# Patient Record
Sex: Male | Born: 1980 | Race: Black or African American | Hispanic: No | Marital: Married | Smoking: Current every day smoker
Health system: Southern US, Community
[De-identification: ages and names within clinical notes are randomized; demographics above are authoritative.]

---

## 2006-05-24 ENCOUNTER — Emergency Department (HOSPITAL_COMMUNITY): Admission: EM | Admit: 2006-05-24 | Discharge: 2006-05-24 | Payer: Self-pay | Admitting: Emergency Medicine

## 2006-06-03 ENCOUNTER — Emergency Department (HOSPITAL_COMMUNITY): Admission: EM | Admit: 2006-06-03 | Discharge: 2006-06-03 | Payer: Self-pay | Admitting: Emergency Medicine

## 2009-02-04 ENCOUNTER — Emergency Department (HOSPITAL_COMMUNITY): Admission: EM | Admit: 2009-02-04 | Discharge: 2009-02-04 | Payer: Self-pay | Admitting: Emergency Medicine

## 2009-11-09 ENCOUNTER — Emergency Department (HOSPITAL_COMMUNITY): Admission: EM | Admit: 2009-11-09 | Discharge: 2009-11-10 | Payer: Self-pay | Admitting: Emergency Medicine

## 2010-02-24 ENCOUNTER — Emergency Department (HOSPITAL_COMMUNITY): Admission: EM | Admit: 2010-02-24 | Discharge: 2010-02-24 | Payer: Self-pay | Admitting: Emergency Medicine

## 2010-12-30 LAB — CBC
Hemoglobin: 14.7 g/dL (ref 13.0–17.0)
MCHC: 34 g/dL (ref 30.0–36.0)
Platelets: 266 10*3/uL (ref 150–400)
WBC: 15.6 10*3/uL — ABNORMAL HIGH (ref 4.0–10.5)

## 2010-12-30 LAB — BASIC METABOLIC PANEL
BUN: 18 mg/dL (ref 6–23)
Creatinine, Ser: 1.5 mg/dL (ref 0.4–1.5)
GFR calc Af Amer: >60 mL/min (ref >60)
GFR calc non Af Amer: 55 mL/min — ABNORMAL LOW (ref >60)
Glucose, Bld: 113 mg/dL — ABNORMAL HIGH (ref 70–99)

## 2010-12-30 LAB — DIFFERENTIAL
Basophils Absolute: 0 10*3/uL (ref 0.0–0.1)
Eosinophils Absolute: 0 10*3/uL (ref 0.0–0.7)
Eosinophils Relative: 0 % (ref 0–5)
Lymphs Abs: 0.6 10*3/uL — ABNORMAL LOW (ref 0.7–4.0)
Neutro Abs: 14.2 10*3/uL — ABNORMAL HIGH (ref 1.7–7.7)
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
## 2011-05-21 ENCOUNTER — Ambulatory Visit: Payer: Self-pay | Admitting: Family Medicine

## 2014-05-31 ENCOUNTER — Encounter (HOSPITAL_COMMUNITY): Payer: Self-pay | Admitting: Emergency Medicine

## 2014-05-31 ENCOUNTER — Emergency Department (HOSPITAL_COMMUNITY)
Admission: EM | Admit: 2014-05-31 | Discharge: 2014-05-31 | Disposition: A | Discharge status: Home / Self Care | Payer: Self-pay | Attending: Emergency Medicine | Admitting: Emergency Medicine

## 2014-05-31 DIAGNOSIS — J029 Acute pharyngitis, unspecified: Secondary | ICD-10-CM | POA: Insufficient documentation

## 2014-05-31 DIAGNOSIS — F17200 Nicotine dependence, unspecified, uncomplicated: Secondary | ICD-10-CM | POA: Insufficient documentation

## 2014-05-31 MED ORDER — LIDOCAINE VISCOUS 2 % MT SOLN
10.0000 mL | Freq: Once | OROMUCOSAL | Status: AC
  Administered 2014-05-31: 10 mL via OROMUCOSAL
  Filled 2014-05-31: qty 15

## 2014-05-31 MED ORDER — MAGIC MOUTHWASH W/LIDOCAINE: 10.0000 mL | Freq: Four times a day (QID) | ORAL | Status: DC | PRN

## 2014-05-31 MED ORDER — PENICILLIN G BENZATHINE 1200000 UNIT/2ML IM SUSP
1.2000 10*6.[IU] | Freq: Once | INTRAMUSCULAR | Status: AC
  Administered 2014-05-31: 1.2 10*6.[IU] via INTRAMUSCULAR
  Filled 2014-05-31: qty 2

## 2014-05-31 MED ORDER — IBUPROFEN 800 MG PO TABS
800.0000 mg | ORAL_TABLET | Freq: Once | ORAL | Status: AC
  Administered 2014-05-31: 800 mg via ORAL
  Filled 2014-05-31: qty 1

## 2014-05-31 MED ORDER — IBUPROFEN 600 MG PO TABS: 600.0000 mg | ORAL_TABLET | Freq: Three times a day (TID) | ORAL | Status: DC | PRN

## 2014-05-31 NOTE — ED Provider Notes (Signed)
CSN: [id number]     Arrival date & time 05/31/14  1829 History   First MD Initiated Contact with Patient 05/31/14 2024     Chief Complaint  Patient presents with  . Sore Throat     (Consider location/radiation/quality/duration/timing/severity/associated sxs/prior Treatment) The history is provided by the patient.   Ray Kramer is a 33 y.o. male presenting with a 24-hour history of sore throat, generalized fatigue, subjective headache and soreness of his gums and soft palate.  Pain is worse with swallowing and he has maintained a soft food and liquid diet today.  He has had no nausea or vomiting, denies cough, chest pain, nasal congestion.  He is taken no medications prior to arrival for his symptoms.     History reviewed. No pertinent past medical history. History reviewed. No pertinent past surgical history. No family history on file. History  Substance Use Topics  . Smoking status: Current Every Day Smoker    Types: Cigars  . Smokeless tobacco: Not on file  . Alcohol Use: Yes    Review of Systems  Constitutional: Positive for fever and chills.  HENT: Positive for sore throat. Negative for congestion, ear pain, rhinorrhea, sinus pressure, trouble swallowing and voice change.   Eyes: Negative for discharge.  Respiratory: Negative for cough, shortness of breath, wheezing and stridor.   Cardiovascular: Negative for chest pain.  Gastrointestinal: Negative for abdominal pain.  Genitourinary: Negative.       Allergies  Review of patient's allergies indicates no known allergies.  Home Medications   Prior to Admission medications   Medication Sig Start Date End Date Taking? Authorizing Provider  Alum & Mag Hydroxide-Simeth (MAGIC MOUTHWASH W/LIDOCAINE) SOLN Take 10 mLs by mouth 4 (four) times daily as needed (throat pain). 05/31/14   Burgess AmorJulie Esaias Cleavenger, PA-C  ibuprofen (ADVIL,MOTRIN) 600 MG tablet Take 1 tablet (600 mg total) by mouth every 8 (eight) hours as needed for  fever or moderate pain. 05/31/14   Burgess AmorJulie Marguriete Wootan, PA-C   BP 126/65  Pulse 83  Temp(Src) 100.1 F (37.8 C) (Oral)  Resp 20  Ht 6\' 2"  (1.88 m)  Wt 245 lb (111.131 kg)  BMI 31.44 kg/m2  SpO2 100% Physical Exam  Nursing note and vitals reviewed. Constitutional: He is oriented to person, place, and time. He appears well-developed and well-nourished.  Low-grade fever at 100.1  HENT:  Head: Normocephalic and atraumatic.  Right Ear: Tympanic membrane and ear canal normal.  Left Ear: Tympanic membrane and ear canal normal.  Nose: No mucosal edema or rhinorrhea.  Mouth/Throat: Uvula is midline and mucous membranes are normal. Posterior oropharyngeal erythema present. No oropharyngeal exudate, posterior oropharyngeal edema or tonsillar abscesses.  Petechiae noted on soft palate.  He has no mucosal or gingival lesions, although gingiva does appear slightly hyperemic along anterior lower central incisors.  He has bilateral submandibular adenopathy.  Eyes: Conjunctivae are normal.  Neck: Normal range of motion. Neck supple.  Cardiovascular: Normal rate and normal heart sounds.   Pulmonary/Chest: Effort normal. No respiratory distress. He has no wheezes. He has no rales.  Abdominal: Soft. There is no tenderness.  Musculoskeletal: Normal range of motion.  Neurological: He is alert and oriented to person, place, and time.  Skin: Skin is warm and dry. No rash noted.  Psychiatric: He has a normal mood and affect.    ED Course  Procedures (including critical care time) Labs Review Labs Reviewed - No data to display  Imaging Review No results found.   EKG  Interpretation None      MDM   Final diagnoses:  Pharyngitis    Pharyngitis and acute tonsillitis.  Patient was given Bicillin LA injection.  He was prescribed ibuprofen and Magic mouthwash for throat pain relief.  He was encouraged rest, increase fluid intake.  Recheck here for any worsened or persistent symptoms.  He and his wife agree  with treatment plan.    Burgess Amor, PA-C 05/31/14 2128

## 2014-05-31 NOTE — ED Notes (Signed)
Sore throat since Tuesday. Soreness to gums. Possible fever the other night with slight headache yesterday.

## 2014-05-31 NOTE — Discharge Instructions (Signed)
Pharyngitis Pharyngitis is redness, pain, and swelling (inflammation) of your pharynx.  CAUSES  Pharyngitis is usually caused by infection. Most of the time, these infections are from viruses (viral) and are part of a cold. However, sometimes pharyngitis is caused by bacteria (bacterial). Pharyngitis can also be caused by allergies. Viral pharyngitis may be spread from person to person by coughing, sneezing, and personal items or utensils (cups, forks, spoons, toothbrushes). Bacterial pharyngitis may be spread from person to person by more intimate contact, such as kissing.  SIGNS AND SYMPTOMS  Symptoms of pharyngitis include:   Sore throat.   Tiredness (fatigue).   Low-grade fever.   Headache.  Joint pain and muscle aches.  Skin rashes.  Swollen lymph nodes.  Plaque-like film on throat or tonsils (often seen with bacterial pharyngitis). DIAGNOSIS  Your health care provider will ask you questions about your illness and your symptoms. Your medical history, along with a physical exam, is often all that is needed to diagnose pharyngitis. Sometimes, a rapid strep test is done. Other lab tests may also be done, depending on the suspected cause.  TREATMENT  Viral pharyngitis will usually get better in 3-4 days without the use of medicine. Bacterial pharyngitis is treated with medicines that kill germs (antibiotics).  HOME CARE INSTRUCTIONS   Drink enough water and fluids to keep your urine clear or pale yellow.   Only take over-the-counter or prescription medicines as directed by your health care provider:   If you are prescribed antibiotics, make sure you finish them even if you start to feel better.   Do not take aspirin.   Get lots of rest.   Gargle with 8 oz of salt water ( tsp of salt per 1 qt of water) as often as every 1-2 hours to soothe your throat.   Throat lozenges (if you are not at risk for choking) or sprays may be used to soothe your throat. SEEK MEDICAL  CARE IF:   You have large, tender lumps in your neck.  You have a rash.  You cough up green, yellow-brown, or bloody spit. SEEK IMMEDIATE MEDICAL CARE IF:   Your neck becomes stiff.  You drool or are unable to swallow liquids.  You vomit or are unable to keep medicines or liquids down.  You have severe pain that does not go away with the use of recommended medicines.  You have trouble breathing (not caused by a stuffy nose). MAKE SURE YOU:   Understand these instructions.  Will watch your condition.  Will get help right away if you are not doing well or get worse. Document Released: 09/29/2005 Document Revised: 07/20/2013 Document Reviewed: 06/06/2013 [facility] Patient Information 2015 [place], [place]. This information is not intended to replace advice given to you by your health care provider. Make sure you discuss any questions you have with your health care provider.   I suspect you have strep throat and have been treated for this condition tonight with this long acting penicillin injection.  You may use the Magic mouthwash solution, gargle and spit every 4 hours if needed for mouth and throat pain.  Ibuprofen can also help with throat pain and fever.  Rest and to drinking plenty of fluids.  Return here for any worsened symptoms.

## 2014-06-02 NOTE — ED Provider Notes (Signed)
Medical screening examination/treatment/procedure(s) were performed by non-physician practitioner and as supervising physician I was immediately available for consultation/collaboration.   EKG Interpretation None        Vanetta MuldersScott Erial Fikes, MD 06/02/14 1546

## 2014-06-03 ENCOUNTER — Encounter (HOSPITAL_COMMUNITY): Payer: Self-pay | Admitting: Emergency Medicine

## 2014-06-03 ENCOUNTER — Emergency Department (HOSPITAL_COMMUNITY)
Admission: EM | Admit: 2014-06-03 | Discharge: 2014-06-03 | Disposition: A | Discharge status: Home / Self Care | Payer: Self-pay | Attending: Emergency Medicine | Admitting: Emergency Medicine

## 2014-06-03 DIAGNOSIS — Z79899 Other long term (current) drug therapy: Secondary | ICD-10-CM | POA: Insufficient documentation

## 2014-06-03 DIAGNOSIS — F17200 Nicotine dependence, unspecified, uncomplicated: Secondary | ICD-10-CM | POA: Insufficient documentation

## 2014-06-03 DIAGNOSIS — K089 Disorder of teeth and supporting structures, unspecified: Secondary | ICD-10-CM | POA: Insufficient documentation

## 2014-06-03 DIAGNOSIS — R21 Rash and other nonspecific skin eruption: Secondary | ICD-10-CM | POA: Insufficient documentation

## 2014-06-03 LAB — RAPID STREP SCREEN (MED CTR MEBANE ONLY): Streptococcus, Group A Screen (Direct): NEGATIVE

## 2014-06-03 LAB — MONONUCLEOSIS SCREEN: MONO SCREEN: NEGATIVE

## 2014-06-03 MED ORDER — LORATADINE 10 MG PO TABS
10.0000 mg | ORAL_TABLET | Freq: Once | ORAL | Status: AC
  Administered 2014-06-03: 10 mg via ORAL
  Filled 2014-06-03: qty 1

## 2014-06-03 MED ORDER — LORATADINE 10 MG PO TABS: 10.0000 mg | ORAL_TABLET | Freq: Every day | ORAL | Status: DC

## 2014-06-03 MED ORDER — PREDNISONE 20 MG PO TABS
40.0000 mg | ORAL_TABLET | Freq: Once | ORAL | Status: AC
  Administered 2014-06-03: 40 mg via ORAL
  Filled 2014-06-03: qty 2

## 2014-06-03 MED ORDER — PREDNISONE 10 MG PO TABS: 20.0000 mg | ORAL_TABLET | Freq: Two times a day (BID) | ORAL | Status: DC

## 2014-06-03 NOTE — Discharge Instructions (Signed)
Your rash could be due to the rash that comes with strep throat or it could be an allergic reaction to penicillin. The strep screen today was negative but that could be because you have already been treated with the antibiotic.  Take the medication as directed for itching. You may continue to take Benadryl as needed. Follow up with your doctor. He may want to have you tested for penicillin allergy. Return here as needed for worsening symptoms.

## 2014-06-03 NOTE — ED Provider Notes (Signed)
CSN: [id number]     Arrival date & time 06/03/14  0900 History   First MD Initiated Contact with Patient 06/03/14 [phone]     Chief Complaint  Patient presents with  . Rash     (Consider location/radiation/quality/duration/timing/severity/associated sxs/prior Treatment) Patient is a 33 y.o. male presenting with rash. The history is provided by the patient.  Rash Location:  Full body Quality: dryness, itchiness and redness   Severity:  Severe Onset quality:  Gradual Timing:  Constant Chronicity:  New Relieved by:  Nothing Associated symptoms: no abdominal pain, no fever, no headaches, no nausea, no sore throat and not vomiting    Ray Kramer is a 33 y.o. male who presents to the ED with a rash. He was treated with an injection of penicillin for positive strep throat 8/19 and the rash started the day after. He has been taking Benadryl for the itching without relief. His throat is much better, no difficulty swallowing and no pain with swallowing. He does complain of gum pain around the left lower wisdom tooth that is trying to come in.  History reviewed. No pertinent past medical history. History reviewed. No pertinent past surgical history. No family history on file. History  Substance Use Topics  . Smoking status: Current Every Day Smoker    Types: Cigars  . Smokeless tobacco: Not on file  . Alcohol Use: Yes    Review of Systems  Constitutional: Negative for fever and chills.  HENT: Positive for dental problem. Negative for sore throat and trouble swallowing.   Eyes: Negative for visual disturbance.  Gastrointestinal: Negative for nausea, vomiting and abdominal pain.  Skin: Positive for rash.  Neurological: Negative for headaches.  Psychiatric/Behavioral: Negative for confusion. The patient is not nervous/anxious.       Allergies  Review of patient's allergies indicates no known allergies.  Home Medications   Prior to Admission medications   Medication Sig  Start Date End Date Taking? Authorizing Provider  Alum & Mag Hydroxide-Simeth (MAGIC MOUTHWASH W/LIDOCAINE) SOLN Take 10 mLs by mouth 4 (four) times daily as needed (throat pain). 05/31/14   Burgess Amor, PA-C  ibuprofen (ADVIL,MOTRIN) 600 MG tablet Take 1 tablet (600 mg total) by mouth every 8 (eight) hours as needed for fever or moderate pain. 05/31/14   Burgess Amor, PA-C  BP 112/51  Pulse 76  Temp(Src) 98.6 F (37 C) (Oral)  Resp 18  SpO2 100%  Physical Exam  Nursing note and vitals reviewed. Constitutional: He is oriented to person, place, and time. He appears well-developed and well-nourished.  HENT:  Head: Normocephalic and atraumatic.  Mouth/Throat: Uvula is midline. Posterior oropharyngeal erythema: mild.  Eyes: Conjunctivae and EOM are normal.  Neck: Neck supple.  Cardiovascular: Normal rate, regular rhythm and normal heart sounds.   Pulmonary/Chest: Effort normal. No respiratory distress. He has no wheezes.  Abdominal: Soft. There is no tenderness.  Musculoskeletal: Normal range of motion.  Lymphadenopathy:    He has no cervical adenopathy.  Neurological: He is alert and oriented to person, place, and time. No cranial nerve deficit.  Skin:  There is a fine sandpaper like rash over the body. The patient has itching.   Psychiatric: He has a normal mood and affect. His behavior is normal.   Results for orders placed during the hospital encounter of 06/03/14 (from the past 24 hour(s))  RAPID STREP SCREEN     Status: None   Collection Time    06/03/14  9:47 AM  Result Value Ref Range   Streptococcus, Group A Screen (Direct) NEGATIVE  NEGATIVE  MONONUCLEOSIS SCREEN     Status: None   Collection Time    06/03/14  9:47 AM      Result Value Ref Range   Mono Screen NEGATIVE  NEGATIVE    ED Course  Procedures Dr. Rubin Payor in to examine the patient and request Mono test and Rapid strep.   MDM  33 y.o. male with rash and itching that started after being dx with strep  throat and having an injection of penicillin. I discussed with the patient that this could be the scarlet fever rash associated with strep throat or he could have an allergic reaction to penicillin. He will not take penicillin again until he is tested for the allergy. Stable for discharge without difficulty swallowing and without respiratory symptoms. He will follow up with his PCP. He will return here as needed for any problems. Will treat for itching and possible allergic reaction.    BP 116/59  Pulse 72  Temp(Src) 98.6 F (37 C) (Oral)  Resp 18  SpO2 99%     Medication List    TAKE these medications       loratadine 10 MG tablet  Commonly known as:  CLARITIN  Take 1 tablet (10 mg total) by mouth daily.     predniSONE 10 MG tablet  Commonly known as:  DELTASONE  Take 2 tablets (20 mg total) by mouth 2 (two) times daily with a meal.      ASK your doctor about these medications       diphenhydrAMINE 25 MG tablet  Commonly known as:  BENADRYL  Take 25 mg by mouth every 6 (six) hours as needed for itching.     ibuprofen 600 MG tablet  Commonly known as:  ADVIL,MOTRIN  Take 1 tablet (600 mg total) by mouth every 8 (eight) hours as needed for fever or moderate pain.     magic mouthwash w/lidocaine Soln  Take 10 mLs by mouth 4 (four) times daily as needed (throat pain).          [facility] Orlene Och, [place] 06/03/14 [phone]

## 2014-06-03 NOTE — ED Notes (Signed)
Pt states that he was in er on 05/31/2014, diagnosed with strep throat, received pcn shot, states that he broke out in rash the next day, has tried benadryl with no relief, last dose was yesterday 7pm, pt also c/o soreness to gums due to wisdom teeth "coming in"

## 2014-06-04 NOTE — ED Provider Notes (Signed)
Medical screening examination/treatment/procedure(s) were conducted as a shared visit with non-physician practitioner(s) and myself.  I personally evaluated the patient during the encounter.   EKG Interpretation None     Patient with rash. May be reaction to PCN. discharge  Juliet Rude. Rubin Payor, MD 06/04/14 0700

## 2014-06-05 LAB — CULTURE, GROUP A STREP

## 2014-06-25 ENCOUNTER — Emergency Department (HOSPITAL_COMMUNITY)
Admission: EM | Admit: 2014-06-25 | Discharge: 2014-06-25 | Disposition: A | Discharge status: Home / Self Care | Payer: Self-pay | Attending: Emergency Medicine | Admitting: Emergency Medicine

## 2014-06-25 ENCOUNTER — Encounter (HOSPITAL_COMMUNITY): Payer: Self-pay | Admitting: Emergency Medicine

## 2014-06-25 DIAGNOSIS — F17200 Nicotine dependence, unspecified, uncomplicated: Secondary | ICD-10-CM | POA: Insufficient documentation

## 2014-06-25 DIAGNOSIS — M549 Dorsalgia, unspecified: Secondary | ICD-10-CM | POA: Insufficient documentation

## 2014-06-25 DIAGNOSIS — R109 Unspecified abdominal pain: Secondary | ICD-10-CM | POA: Insufficient documentation

## 2014-06-25 LAB — CBC WITH DIFFERENTIAL/PLATELET
Basophils Absolute: 0 10*3/uL (ref 0.0–0.1)
Basophils Relative: 0 % (ref 0–1)
Eosinophils Absolute: 0.1 10*3/uL (ref 0.0–0.7)
Eosinophils Relative: 2 % (ref 0–5)
HCT: 41.9 % (ref 39.0–52.0)
Hemoglobin: 14.3 g/dL (ref 13.0–17.0)
Lymphocytes Relative: 23 % (ref 12–46)
Lymphs Abs: 0.9 10*3/uL (ref 0.7–4.0)
MCH: 29.2 pg (ref 26.0–34.0)
MCHC: 34.1 g/dL (ref 30.0–36.0)
MCV: 85.7 fL (ref 78.0–100.0)
Monocytes Absolute: 0.4 10*3/uL (ref 0.1–1.0)
Monocytes Relative: 10 % (ref 3–12)
Neutro Abs: 2.5 10*3/uL (ref 1.7–7.7)
Neutrophils Relative %: 65 % (ref 43–77)
Platelets: 225 10*3/uL (ref 150–400)
RBC: 4.89 MIL/uL (ref 4.22–5.81)
RDW: 13.9 % (ref 11.5–15.5)
WBC: 3.8 10*3/uL — ABNORMAL LOW (ref 4.0–10.5)

## 2014-06-25 LAB — BASIC METABOLIC PANEL
Anion gap: 9 (ref 5–15)
BUN: 13 mg/dL (ref 6–23)
CO2: 26 mEq/L (ref 19–32)
Calcium: 9.2 mg/dL (ref 8.4–10.5)
Chloride: 102 mEq/L (ref 96–112)
Creatinine, Ser: 0.92 mg/dL (ref 0.50–1.35)
GFR calc Af Amer: >90 mL/min (ref >90)
GFR calc non Af Amer: >90 mL/min (ref >90)
Glucose, Bld: 110 mg/dL — ABNORMAL HIGH (ref 70–99)
Potassium: 4.2 mEq/L (ref 3.7–5.3)
Sodium: 137 mEq/L (ref 137–147)

## 2014-06-25 LAB — URINALYSIS, ROUTINE W REFLEX MICROSCOPIC
Bilirubin Urine: NEGATIVE
Glucose, UA: NEGATIVE mg/dL
Ketones, ur: NEGATIVE mg/dL
Leukocytes, UA: NEGATIVE
Nitrite: NEGATIVE
Protein, ur: NEGATIVE mg/dL
Specific Gravity, Urine: 1.02 (ref 1.005–1.030)
Urobilinogen, UA: 0.2 mg/dL (ref 0.0–1.0)
pH: 6 (ref 5.0–8.0)

## 2014-06-25 LAB — URINE MICROSCOPIC-ADD ON

## 2014-06-25 MED ORDER — IBUPROFEN 400 MG PO TABS
600.0000 mg | ORAL_TABLET | Freq: Once | ORAL | Status: AC
  Administered 2014-06-25: 600 mg via ORAL
  Filled 2014-06-25: qty 2

## 2014-06-25 MED ORDER — DIAZEPAM 5 MG PO TABS
2.5000 mg | ORAL_TABLET | Freq: Once | ORAL | Status: AC
  Administered 2014-06-25: 2.5 mg via ORAL
  Filled 2014-06-25: qty 1

## 2014-06-25 MED ORDER — OXYCODONE-ACETAMINOPHEN 5-325 MG PO TABS
2.0000 | ORAL_TABLET | Freq: Once | ORAL | Status: AC
  Administered 2014-06-25: 2 via ORAL
  Filled 2014-06-25: qty 2

## 2014-06-25 MED ORDER — NAPROXEN 375 MG PO TABS: 375.0000 mg | ORAL_TABLET | Freq: Two times a day (BID) | ORAL | Status: DC | PRN

## 2014-06-25 MED ORDER — CYCLOBENZAPRINE HCL 10 MG PO TABS: 10.0000 mg | ORAL_TABLET | Freq: Three times a day (TID) | ORAL | Status: DC | PRN

## 2014-06-25 NOTE — ED Notes (Signed)
Pt denies knowledge of injury, but states "it feels like muscle pain and it hurts worse when I move."

## 2014-06-25 NOTE — ED Notes (Signed)
Pt with left flank pain since Friday night, denies hx of kidney stones and blood in urine

## 2014-06-25 NOTE — Discharge Instructions (Signed)
Back Pain, Adult Low back pain is very common. About 1 in 5 people have back pain.The cause of low back pain is rarely dangerous. The pain often gets better over time.About half of people with a sudden onset of back pain feel better in just 2 weeks. About 8 in 10 people feel better by 6 weeks.  CAUSES Some common causes of back pain include:  Strain of the muscles or ligaments supporting the spine.  Wear and tear (degeneration) of the spinal discs.  Arthritis.  Direct injury to the back. DIAGNOSIS Most of the time, the direct cause of low back pain is not known.However, back pain can be treated effectively even when the exact cause of the pain is unknown.Answering your caregiver's questions about your overall health and symptoms is one of the most accurate ways to make sure the cause of your pain is not dangerous. If your caregiver needs more information, he or she may order lab work or imaging tests (X-rays or MRIs).However, even if imaging tests show changes in your back, this usually does not require surgery. HOME CARE INSTRUCTIONS For many people, back pain returns.Since low back pain is rarely dangerous, it is often a condition that people can learn to manageon their own.   Remain active. It is stressful on the back to sit or stand in one place. Do not sit, drive, or stand in one place for more than 30 minutes at a time. Take short walks on level surfaces as soon as pain allows.Try to increase the length of time you walk each day.  Do not stay in bed.Resting more than 1 or 2 days can delay your recovery.  Do not avoid exercise or work.Your body is made to move.It is not dangerous to be active, even though your back may hurt.Your back will likely heal faster if you return to being active before your pain is gone.  Pay attention to your body when you bend and lift. Many people have less discomfortwhen lifting if they bend their knees, keep the load close to their bodies,and  avoid twisting. Often, the most comfortable positions are those that put less stress on your recovering back.  Find a comfortable position to sleep. Use a firm mattress and lie on your side with your knees slightly bent. If you lie on your back, put a pillow under your knees.  Only take over-the-counter or prescription medicines as directed by your caregiver. Over-the-counter medicines to reduce pain and inflammation are often the most helpful.Your caregiver may prescribe muscle relaxant drugs.These medicines help dull your pain so you can more quickly return to your normal activities and healthy exercise.  Put ice on the injured area.  Put ice in a plastic bag.  Place a towel between your skin and the bag.  Leave the ice on for 15-20 minutes, 03-04 times a day for the first 2 to 3 days. After that, ice and heat may be alternated to reduce pain and spasms.  Ask your caregiver about trying back exercises and gentle massage. This may be of some benefit.  Avoid feeling anxious or stressed.Stress increases muscle tension and can worsen back pain.It is important to recognize when you are anxious or stressed and learn ways to manage it.Exercise is a great option. SEEK MEDICAL CARE IF:  You have pain that is not relieved with rest or medicine.  You have pain that does not improve in 1 week.  You have new symptoms.  You are generally not feeling well. SEEK   IMMEDIATE MEDICAL CARE IF:   You have pain that radiates from your back into your legs.  You develop new bowel or bladder control problems.  You have unusual weakness or numbness in your arms or legs.  You develop nausea or vomiting.  You develop abdominal pain.  You feel faint. Document Released: 09/29/2005 Document Revised: 03/30/2012 Document Reviewed: 01/31/2014 [employer] Patient Information 2015 [employer], [employer]. This information is not intended to replace advice given to you by your health care provider. Make sure you  discuss any questions you have with your health care provider.  

## 2014-07-04 NOTE — ED Provider Notes (Signed)
CSN: [id number]     Arrival date & time 06/25/14  1115 History   First MD Initiated Contact with Patient 06/25/14 1310     Chief Complaint  Patient presents with  . Flank Pain     (Consider location/radiation/quality/duration/timing/severity/associated sxs/prior Treatment) HPI  32yM with L flank/L mid back pain. First noticed Friday. Denies trauma. Persistent since. Minimal/no pain at rest. Worse with movement. No urinary complaints. No acute numbness, tingling or loss of strength. No rash. No hx of similar symptoms. No fever or chills.   History reviewed. No pertinent past medical history. History reviewed. No pertinent past surgical history. History reviewed. No pertinent family history. History  Substance Use Topics  . Smoking status: Current Every Day Smoker    Types: Cigars  . Smokeless tobacco: Not on file  . Alcohol Use: Yes    Review of Systems  All systems reviewed and negative, other than as noted in HPI.   Allergies  Penicillins  Home Medications   Prior to Admission medications   Medication Sig Start Date End Date Taking? Authorizing Provider  ibuprofen (ADVIL,MOTRIN) 800 MG tablet Take 800 mg by mouth daily as needed for moderate pain.   Yes Historical Provider, MD  cyclobenzaprine (FLEXERIL) 10 MG tablet Take 1 tablet (10 mg total) by mouth 3 (three) times daily as needed for muscle spasms. 06/25/14   Raeford Razor, MD  naproxen (NAPROSYN) 375 MG tablet Take 1 tablet (375 mg total) by mouth 2 (two) times daily as needed. 06/25/14   Raeford Razor, MD   BP 131/78  Pulse 73  Temp(Src) 98 F (36.7 C) (Oral)  Resp 20  Ht  (1.88 m)  Wt 245 lb (111.131 kg)  BMI 31.44 kg/m2  SpO2 100% Physical Exam  Nursing note and vitals reviewed. Constitutional: He appears well-developed and well-nourished. No distress.  HENT:  Head: Normocephalic and atraumatic.  Eyes: Conjunctivae are normal. Right eye exhibits no discharge. Left eye exhibits no discharge.  Neck:  Neck supple.  Cardiovascular: Normal rate, regular rhythm and normal heart sounds.  Exam reveals no gallop and no friction rub.   No murmur heard. Pulmonary/Chest: Effort normal and breath sounds normal. No respiratory distress.  Abdominal: Soft. He exhibits no distension. There is no tenderness.  Genitourinary:  No cva tenderness.   Musculoskeletal: He exhibits no edema and no tenderness.  Back pain not reproducible.   Neurological: He is alert. No cranial nerve deficit. He exhibits normal muscle tone. Coordination normal.  Normal appearing gait.   Skin: Skin is warm and dry.  Psychiatric: He has a normal mood and affect. His behavior is normal. Thought content normal.    ED Course  Procedures (including critical care time) Labs Review Labs Reviewed  URINALYSIS, ROUTINE W REFLEX MICROSCOPIC - Abnormal; Notable for the following:    Hgb urine dipstick TRACE (*)    All other components within normal limits  CBC WITH DIFFERENTIAL - Abnormal; Notable for the following:    WBC 3.8 (*)    All other components within normal limits  BASIC METABOLIC PANEL - Abnormal; Notable for the following:    Glucose, Bld 110 (*)    All other components within normal limits  URINE MICROSCOPIC-ADD ON    Imaging Review No results found.   EKG Interpretation None      MDM   Final diagnoses:  Mid back pain on left side    32yM with likely muscular strain. No urinary complaints. No midline tenderness. Nonfocal neuro exam.  Plan symptomatic tx.     Raeford Razor, MD 07/04/14 [phone]

## 2016-05-08 ENCOUNTER — Emergency Department (HOSPITAL_COMMUNITY)
Admission: EM | Admit: 2016-05-08 | Discharge: 2016-05-08 | Disposition: A | Discharge status: Home / Self Care | Payer: Self-pay | Attending: Emergency Medicine | Admitting: Emergency Medicine

## 2016-05-08 ENCOUNTER — Encounter (HOSPITAL_COMMUNITY): Payer: Self-pay | Admitting: Emergency Medicine

## 2016-05-08 DIAGNOSIS — S50812A Abrasion of left forearm, initial encounter: Secondary | ICD-10-CM | POA: Insufficient documentation

## 2016-05-08 DIAGNOSIS — Y939 Activity, unspecified: Secondary | ICD-10-CM | POA: Insufficient documentation

## 2016-05-08 DIAGNOSIS — S60512A Abrasion of left hand, initial encounter: Secondary | ICD-10-CM | POA: Insufficient documentation

## 2016-05-08 DIAGNOSIS — S50811A Abrasion of right forearm, initial encounter: Secondary | ICD-10-CM | POA: Insufficient documentation

## 2016-05-08 DIAGNOSIS — S80212A Abrasion, left knee, initial encounter: Secondary | ICD-10-CM | POA: Insufficient documentation

## 2016-05-08 DIAGNOSIS — Y999 Unspecified external cause status: Secondary | ICD-10-CM | POA: Insufficient documentation

## 2016-05-08 DIAGNOSIS — Y929 Unspecified place or not applicable: Secondary | ICD-10-CM | POA: Insufficient documentation

## 2016-05-08 DIAGNOSIS — X58XXXA Exposure to other specified factors, initial encounter: Secondary | ICD-10-CM | POA: Insufficient documentation

## 2016-05-08 DIAGNOSIS — T148XXA Other injury of unspecified body region, initial encounter: Secondary | ICD-10-CM

## 2016-05-08 DIAGNOSIS — F17210 Nicotine dependence, cigarettes, uncomplicated: Secondary | ICD-10-CM | POA: Insufficient documentation

## 2016-05-08 DIAGNOSIS — S20411A Abrasion of right back wall of thorax, initial encounter: Secondary | ICD-10-CM | POA: Insufficient documentation

## 2016-05-08 MED ORDER — BACITRACIN ZINC 500 UNIT/GM EX OINT
TOPICAL_OINTMENT | CUTANEOUS | Status: AC
Start: 2016-05-08 — End: 2016-05-08
  Administered 2016-05-08: 5
  Filled 2016-05-08: qty 4.5

## 2016-05-08 MED ORDER — CEPHALEXIN 500 MG PO CAPS: 500.0000 mg | ORAL_CAPSULE | Freq: Two times a day (BID) | ORAL | 0 refills | Status: DC

## 2016-05-08 NOTE — ED Triage Notes (Addendum)
Pt reports coming home from work Tuesday, took off left boot (steel toe) and found area of redness, draining clear fluid.  Pt has area of redness on anterior left foot with scabbing size of a baseball.  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 Pt does not, however, recall any of the chemical spilling into his boot. Pt CMS intact, ambulatory.

## 2016-05-08 NOTE — Discharge Instructions (Signed)
Cephalexin tablets or capsules What is this medicine? CEPHALEXIN (sef a LEX in) is a cephalosporin antibiotic. It is used to treat certain kinds of bacterial infections It will not work for colds, flu, or other viral infections. This medicine may be used for other purposes; ask your health care provider or pharmacist if you have questions. What should I tell my health care provider before I take this medicine? They need to know if you have any of these conditions: -kidney disease -stomach or intestine problems, especially colitis -an unusual or allergic reaction to cephalexin, other cephalosporins, penicillins, other antibiotics, medicines, foods, dyes or preservatives -pregnant or trying to get pregnant -breast-feeding How should I use this medicine? Take this medicine by mouth with a full glass of water. Follow the directions on the prescription label. This medicine can be taken with or without food. Take your medicine at regular intervals. Do not take your medicine more often than directed. Take all of your medicine as directed even if you think you are better. Do not skip doses or stop your medicine early. Talk to your pediatrician regarding the use of this medicine in children. While this drug may be prescribed for selected conditions, precautions do apply. Overdosage: If you think you have taken too much of this medicine contact a poison control center or emergency room at once. NOTE: This medicine is only for you. Do not share this medicine with others. What if I miss a dose? If you miss a dose, take it as soon as you can. If it is almost time for your next dose, take only that dose. Do not take double or extra doses. There should be at least 4 to 6 hours between doses. What may interact with this medicine? -probenecid -some other antibiotics This list may not describe all possible interactions. Give your health care provider a list of all the medicines, herbs, non-prescription drugs, or  dietary supplements you use. Also tell them if you smoke, drink alcohol, or use illegal drugs. Some items may interact with your medicine. What should I watch for while using this medicine? Tell your doctor or health care professional if your symptoms do not begin to improve in a few days. Do not treat diarrhea with over the counter products. Contact your doctor if you have diarrhea that lasts more than 2 days or if it is severe and watery. If you have diabetes, you may get a false-positive result for sugar in your urine. Check with your doctor or health care professional. What side effects may I notice from receiving this medicine? Side effects that you should report to your doctor or health care professional as soon as possible: -allergic reactions like skin rash, itching or hives, swelling of the face, lips, or tongue -breathing problems -pain or trouble passing urine -redness, blistering, peeling or loosening of the skin, including inside the mouth -severe or watery diarrhea -unusually weak or tired -yellowing of the eyes, skin Side effects that usually do not require medical attention (report to your doctor or health care professional if they continue or are bothersome): -gas or heartburn -genital or anal irritation -headache -joint or muscle pain -nausea, vomiting This list may not describe all possible side effects. Call your doctor for medical advice about side effects. You may report side effects to FDA at 1-800-FDA-1088. Where should I keep my medicine? Keep out of the reach of children. Store at room temperature between 59 and 86 degrees F (15 and 30 degrees C). Throw away any unused  medicine after the expiration date. NOTE: This sheet is a summary. It may not cover all possible information. If you have questions about this medicine, talk to your doctor, pharmacist, or health care provider.    2016, Elsevier/Gold Standard. (2008-01-03 17:09:13) Abrasion An abrasion is a cut or  scrape on the outer surface of your skin. An abrasion does not extend through all of the layers of your skin. It is important to care for your abrasion properly to prevent infection. CAUSES Most abrasions are caused by falling on or gliding across the ground or another surface. When your skin rubs on something, the outer and inner layer of skin rubs off.  SYMPTOMS A cut or scrape is the main symptom of this condition. The scrape may be bleeding, or it may appear red or pink. If there was an associated fall, there may be an underlying bruise. DIAGNOSIS An abrasion is diagnosed with a physical exam. TREATMENT Treatment for this condition depends on how large and deep the abrasion is. Usually, your abrasion will be cleaned with water and mild soap. This removes any dirt or debris that may be stuck. An antibiotic ointment may be applied to the abrasion to help prevent infection. A bandage (dressing) may be placed on the abrasion to keep it clean. You may also need a tetanus shot. HOME CARE INSTRUCTIONS Medicines  Take or apply medicines only as directed by your health care provider.  If you were prescribed an antibiotic ointment, finish all of it even if you start to feel better. Wound Care  Clean the wound with mild soap and water 2-3 times per day or as directed by your health care provider. Pat your wound dry with a clean towel. Do not rub it.  There are many different ways to close and cover a wound. Follow instructions from your health care provider about:  Wound care.  Dressing changes and removal.  Check your wound every day for signs of infection. Watch for:  Redness, swelling, or pain.  Fluid, blood, or pus. General Instructions  Keep the dressing dry as directed by your health care provider. Do not take baths, swim, use a hot tub, or do anything that would put your wound underwater until your health care provider approves.  If there is swelling, raise (elevate) the injured  area above the level of your heart while you are sitting or lying down.  Keep all follow-up visits as directed by your health care provider. This is important. SEEK MEDICAL CARE IF:  You received a tetanus shot and you have swelling, severe pain, redness, or bleeding at the injection site.  Your pain is not controlled with medicine.  You have increased redness, swelling, or pain at the site of your wound. SEEK IMMEDIATE MEDICAL CARE IF:  You have a red streak going away from your wound.  You have a fever.  You have fluid, blood, or pus coming from your wound.  You notice a bad smell coming from your wound or your dressing.   This information is not intended to replace advice given to you by your health care provider. Make sure you discuss any questions you have with your health care provider.   Document Released: 07/09/2005 Document Revised: 06/20/2015 Document Reviewed: 09/27/2014 Elsevier Interactive Patient Education [employer]
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
## 2020-04-26 ENCOUNTER — Other Ambulatory Visit: Payer: Self-pay

## 2020-04-26 ENCOUNTER — Ambulatory Visit
Admission: EM | Admit: 2020-04-26 | Discharge: 2020-04-26 | Disposition: A | Discharge status: Home / Self Care | Payer: Self-pay | Attending: Emergency Medicine | Admitting: Emergency Medicine

## 2020-04-26 ENCOUNTER — Encounter: Payer: Self-pay | Admitting: Emergency Medicine

## 2020-04-26 DIAGNOSIS — Z20822 Contact with and (suspected) exposure to covid-19: Secondary | ICD-10-CM

## 2020-04-26 NOTE — ED Triage Notes (Signed)
Exposed to covid this weekend

## 2020-04-26 NOTE — ED Provider Notes (Signed)
[facility]   [id number] 04/26/20 Arrival Time: 0817   CC: COVID exposure  SUBJECTIVE: History from: patient.  NAZAIRE CORDIAL is a 39 y.o. male who presents for COVID testing.  Denies sick exposure to COVID, flu or strep.  Denies recent travel.  Denies aggravating or alleviating symptoms.  Denies previous COVID infection.   Denies fever, chills, fatigue, nasal congestion, rhinorrhea, sore throat, cough, SOB, wheezing, chest pain, nausea, vomiting, changes in bowel or bladder habits.     Reports having both vaccinations  ROS: As per HPI.  All other pertinent ROS negative.     History reviewed. No pertinent past medical history. History reviewed. No pertinent surgical history. Allergies  Allergen Reactions  . Penicillins Itching and Rash    Has patient had a PCN reaction causing immediate rash, facial/tongue/throat swelling, SOB or lightheadedness with hypotension: Yes Has patient had a PCN reaction causing severe rash involving mucus membranes or skin necrosis: No Has patient had a PCN reaction that required hospitalization No Has patient had a PCN reaction occurring within the last 10 years: Yes If all of the above answers are "NO", then may proceed with Cephalosporin use.    No current facility-administered medications on file prior to encounter.   No current outpatient medications on file prior to encounter.   Social History   Socioeconomic History  . Marital status: Single    Spouse name: Not on file  . Number of children: Not on file  . Years of education: Not on file  . Highest education level: Not on file  Occupational History  . Not on file  Tobacco Use  . Smoking status: Current Every Day Smoker    Types: Cigars  . Smokeless tobacco: Never Used  . Tobacco comment: occ  Substance and Sexual Activity  . Alcohol use: Yes  . Drug use: No  . Sexual activity: Not on file  Other Topics Concern  . Not on file  Social History Narrative  . Not  on file   Social Determinants of Health   Financial Resource Strain:   . Difficulty of Paying Living Expenses:   Food Insecurity:   . Worried About Programme researcher, broadcasting/film/video in the Last Year:   . Barista in the Last Year:   Transportation Needs:   . Freight forwarder (Medical):   Marland Kitchen Lack of Transportation (Non-Medical):   Physical Activity:   . Days of Exercise per Week:   . Minutes of Exercise per Session:   Stress:   . Feeling of Stress :   Social Connections:   . Frequency of Communication with Friends and Family:   . Frequency of Social Gatherings with Friends and Family:   . Attends Religious Services:   . Active Member of Clubs or Organizations:   . Attends Banker Meetings:   Marland Kitchen Marital Status:   Intimate Partner Violence:   . Fear of Current or Ex-Partner:   . Emotionally Abused:   Marland Kitchen Physically Abused:   . Sexually Abused:    No family history on file.  OBJECTIVE:  Vitals:   04/26/20 0843  BP: 137/85  Pulse: 66  Resp: 17  Temp: 98.1 F (36.7 C)  TempSrc: Tympanic  SpO2: 97%     General appearance: alert; well-appearing, nontoxic; speaking in full sentences and tolerating own secretions HEENT: NCAT; Ears: EACs clear, TMs pearly gray; Eyes: PERRL.  EOM grossly intact.Nose: nares patent without rhinorrhea, Throat: oropharynx clear, tonsils non erythematous  or enlarged, uvula midline  Neck: supple without LAD Lungs: unlabored respirations, symmetrical air entry; cough: absent; no respiratory distress; CTAB Heart: regular rate and rhythm.  Skin: warm and dry Psychological: alert and cooperative; normal mood and affect  ASSESSMENT & PLAN:  1. Exposure to COVID-19 virus    COVID testing ordered.  It will take between 2-5 days for test results.  Someone will contact you regarding abnormal results.    In the meantime:  If you were to develop symptoms: You should remain isolated in your home for 10 days from symptom onset AND greater than 72  hours after symptoms resolution (absence of fever without the use of fever-reducing medication and improvement in respiratory symptoms), whichever is longer If you have had exposure: you should remain in quarantine for 7 days from exposure.  However, if symptoms develop you must self isolate and return for retesting Get plenty of rest and push fluids Follow up with PCP as needed Call or go to the ED if you have any new symptoms such as fever, cough, shortness of breath, chest tightness, chest pain, turning blue, changes in mental status, etc...    Reviewed expectations re: course of current medical issues. Questions answered. Outlined signs and symptoms indicating need for more acute intervention. Patient verbalized understanding. After Visit Summary given.         Alvino Chapel [place], PA-C 04/26/20 0900

## 2020-04-26 NOTE — Discharge Instructions (Signed)

## 2020-04-27 LAB — NOVEL CORONAVIRUS, NAA: SARS-CoV-2, NAA: DETECTED — AB

## 2020-04-27 LAB — SARS-COV-2, NAA 2 DAY TAT

## 2020-04-28 ENCOUNTER — Telehealth: Payer: Self-pay | Admitting: Adult Health

## 2020-04-28 NOTE — Telephone Encounter (Signed)
Called patient to screen for MOAB therapy for COVID 19 patients.  He is not having any symptoms and therefore is not eligible.  I gave him our covid clinic phone number to call if he develops any.    Lillard Anes, NP

## 2021-09-23 ENCOUNTER — Emergency Department (HOSPITAL_COMMUNITY)
Admission: EM | Admit: 2021-09-23 | Discharge: 2021-09-23 | Disposition: A | Discharge status: Home / Self Care | Payer: Self-pay | Attending: Emergency Medicine | Admitting: Emergency Medicine

## 2021-09-23 ENCOUNTER — Encounter (HOSPITAL_COMMUNITY): Payer: Self-pay | Admitting: *Deleted

## 2021-09-23 DIAGNOSIS — J101 Influenza due to other identified influenza virus with other respiratory manifestations: Secondary | ICD-10-CM | POA: Insufficient documentation

## 2021-09-23 DIAGNOSIS — Z20822 Contact with and (suspected) exposure to covid-19: Secondary | ICD-10-CM | POA: Insufficient documentation

## 2021-09-23 DIAGNOSIS — F17290 Nicotine dependence, other tobacco product, uncomplicated: Secondary | ICD-10-CM | POA: Insufficient documentation

## 2021-09-23 DIAGNOSIS — J111 Influenza due to unidentified influenza virus with other respiratory manifestations: Secondary | ICD-10-CM

## 2021-09-23 LAB — RESP PANEL BY RT-PCR (FLU A&B, COVID) ARPGX2
Influenza A by PCR: POSITIVE — AB
Influenza B by PCR: NEGATIVE
SARS Coronavirus 2 by RT PCR: NEGATIVE

## 2021-09-23 MED ORDER — ACETAMINOPHEN 500 MG PO TABS
1000.0000 mg | ORAL_TABLET | Freq: Once | ORAL | Status: AC
  Administered 2021-09-23: 1000 mg via ORAL
  Filled 2021-09-23: qty 2

## 2021-09-23 NOTE — ED Provider Notes (Signed)
[facility] EMERGENCY DEPARTMENT Provider Note   CSN: [id number] Arrival date & time: 09/23/21  1236     History Chief Complaint  Patient presents with   Cough    Ray Kramer is a 40 y.o. male.  The history is provided by the patient. No language interpreter was used.  Cough Cough characteristics:  Non-productive Sputum characteristics:  Nondescript Severity:  Moderate Onset quality:  Gradual Duration:  2 days Timing:  Constant Progression:  Worsening Chronicity:  New Smoker: no   Relieved by:  Nothing Worsened by:  Nothing Ineffective treatments:  None tried Associated symptoms: fever   Risk factors: recent infection       History reviewed. No pertinent past medical history.  There are no problems to display for this patient.   History reviewed. No pertinent surgical history.     No family history on file.  Social History   Tobacco Use   Smoking status: Every Day    Types: Cigars   Smokeless tobacco: Never   Tobacco comments:    occ  Substance Use Topics   Alcohol use: Yes   Drug use: No    Home Medications Prior to Admission medications   Not on File    Allergies    Penicillins  Review of Systems   Review of Systems  Constitutional:  Positive for fever.  Respiratory:  Positive for cough.   All other systems reviewed and are negative.  Physical Exam Updated Vital Signs BP 126/84 (BP Location: Right Arm)   Pulse 92   Temp (!) 103.2 F (39.6 C) (Oral)   Resp 20   SpO2 97%   Physical Exam Vitals and nursing note reviewed.  Constitutional:      Appearance: He is well-developed.  HENT:     Head: Normocephalic.     Nose: Nose normal.     Mouth/Throat:     Mouth: Mucous membranes are moist.  Cardiovascular:     Rate and Rhythm: Normal rate.  Pulmonary:     Effort: Pulmonary effort is normal.  Abdominal:     General: There is no distension.  Musculoskeletal:        General: Normal range of motion.     Cervical  back: Normal range of motion.  Neurological:     Mental Status: He is alert and oriented to person, place, and time.  Psychiatric:        Mood and Affect: Mood normal.    ED Results / Procedures / Treatments   Labs (all labs ordered are listed, but only abnormal results are displayed) Labs Reviewed  RESP PANEL BY RT-PCR (FLU A&B, COVID) ARPGX2 - Abnormal; Notable for the following components:      Result Value   Influenza A by PCR POSITIVE (*)    All other components within normal limits    EKG None  Radiology No results found.  Procedures Procedures   Medications Ordered in ED Medications  acetaminophen (TYLENOL) tablet 1,000 mg (1,000 mg Oral Given 09/23/21 1342)    ED Course  I have reviewed the triage vital signs and the nursing notes.  Pertinent labs & imaging results that were available during my care of the patient were reviewed by me and considered in my medical decision making (see chart for details).    MDM Rules/Calculators/A&P                           MDM:  Pt given  tylenol for fever.  Influenza A is positive.   Final Clinical Impression(s) / ED Diagnoses Final diagnoses:  Influenza    Rx / DC Orders ED Discharge Orders     None     An After Visit Summary was printed and given to the patient.    Elson Areas, [place] 09/23/21 1508    Mancel Bale, MD 09/24/21 [phone]

## 2021-09-23 NOTE — ED Triage Notes (Signed)
Flu symptoms, productive cough x 3 days

## 2021-09-23 NOTE — Discharge Instructions (Signed)
Return if any problems.  Tylenol every 4 hours  

## 2022-01-02 ENCOUNTER — Emergency Department (HOSPITAL_COMMUNITY)
Admission: EM | Admit: 2022-01-02 | Discharge: 2022-01-02 | Disposition: A | Discharge status: Home / Self Care | Payer: Self-pay | Attending: Emergency Medicine | Admitting: Emergency Medicine

## 2022-01-02 ENCOUNTER — Encounter (HOSPITAL_COMMUNITY): Payer: Self-pay | Admitting: Emergency Medicine

## 2022-01-02 ENCOUNTER — Other Ambulatory Visit: Payer: Self-pay

## 2022-01-02 DIAGNOSIS — M545 Low back pain, unspecified: Secondary | ICD-10-CM | POA: Insufficient documentation

## 2022-01-02 MED ORDER — NAPROXEN 500 MG PO TABS: 500.0000 mg | ORAL_TABLET | Freq: Two times a day (BID) | ORAL | 0 refills | Status: AC

## 2022-01-02 MED ORDER — KETOROLAC TROMETHAMINE 15 MG/ML IJ SOLN
15.0000 mg | Freq: Once | INTRAMUSCULAR | Status: AC
  Administered 2022-01-02: 15 mg via INTRAMUSCULAR
  Filled 2022-01-02: qty 1

## 2022-01-02 MED ORDER — LIDOCAINE 5 % EX PTCH
1.0000 | MEDICATED_PATCH | CUTANEOUS | Status: DC
  Administered 2022-01-02: 1 via TRANSDERMAL
  Filled 2022-01-02: qty 1

## 2022-01-02 MED ORDER — LIDOCAINE 5 % EX PTCH: 1.0000 | MEDICATED_PATCH | CUTANEOUS | 0 refills | Status: AC

## 2022-01-02 MED ORDER — CYCLOBENZAPRINE HCL 10 MG PO TABS: 10.0000 mg | ORAL_TABLET | Freq: Two times a day (BID) | ORAL | 0 refills | Status: AC | PRN

## 2022-01-02 MED ORDER — CYCLOBENZAPRINE HCL 10 MG PO TABS
10.0000 mg | ORAL_TABLET | Freq: Once | ORAL | Status: AC
  Administered 2022-01-02: 10 mg via ORAL
  Filled 2022-01-02: qty 1

## 2022-01-02 NOTE — ED Triage Notes (Signed)
Pt c/o L lower back pain that radiates to lower middle back x 3 weeks  ?

## 2022-01-02 NOTE — Discharge Instructions (Addendum)
Please take naproxen twice daily for back pain.  You can apply Lidoderm patch every 12 hours.  You can take Flexeril twice daily for pain.  Do not operate heavy machinery when you take Flexeril as it can make you drowsy.  Do not mix with alcohol.  Please return to the emergency department for worsening symptoms. ?

## 2022-01-02 NOTE — ED Provider Notes (Signed)
?[facility] ?Provider Note ? ? ?CSN: [id number] ?Arrival date & time: 01/02/22  0900 ? ?  ? ?History ?No chief complaint on file. ? ? ?Ray Kramer is a 41 y.o. male who presents to the emergency department with left lower back pain that started approximately 3 weeks ago.  Patient states he was doing some wide grip lat pulls and upon leaning forward he felt a pulling sensation in the left lower back and is had pain since.  Pain is worse with movement.  He has been taking ibuprofen intermittently with little relief.  He denies any bowel or bladder incontinence.  No focal weakness or numbness to the lower extremities.  No fever or chills.  Pain does not radiate ? ?HPI ? ?  ? ?Home Medications ?Prior to Admission medications   ?Medication Sig Start Date End Date Taking? Authorizing Provider  ?cyclobenzaprine (FLEXERIL) 10 MG tablet Take 1 tablet (10 mg total) by mouth 2 (two) times daily as needed for muscle spasms. 01/02/22  Yes Yuri Flener M, PA-C  ?lidocaine (LIDODERM) 5 % Place 1 patch onto the skin daily. Remove & Discard patch within 12 hours or as directed by MD 01/02/22  Yes Meredeth Ide, Deneshia Zucker M, PA-C  ?naproxen (NAPROSYN) 500 MG tablet Take 1 tablet (500 mg total) by mouth 2 (two) times daily. 01/02/22  Yes Teressa Lower, PA-C  ?   ? ?Allergies    ?Penicillins   ? ?Review of Systems   ?Review of Systems  ?All other systems reviewed and are negative. ? ?Physical Exam ?Updated Vital Signs ?BP 119/73   Pulse (!) 54   Temp (!) 97.5 ?F (36.4 ?C) (Oral)   Resp 18   Ht 6\' 2"  (1.88 m)   Wt 103.9 kg   SpO2 98%   BMI 29.40 kg/m?  ?Physical Exam ?Vitals and nursing note reviewed.  ?Constitutional:   ?   Appearance: Normal appearance.  ?HENT:  ?   Head: Normocephalic and atraumatic.  ?Eyes:  ?   General:     ?   Right eye: No discharge.     ?   Left eye: No discharge.  ?   Conjunctiva/sclera: Conjunctivae normal.  ?Pulmonary:  ?   Effort: Pulmonary effort is normal.   ?Musculoskeletal:  ?   Comments: No tenderness over the thoracic or lumbar spine.  There is point tenderness over the left paralumbar musculature.  Negative straight leg raise.  5/5 strength to the lower extremities.  Normal sensation to lower extremities.  ?Skin: ?   General: Skin is warm and dry.  ?   Findings: No rash.  ?Neurological:  ?   General: No focal deficit present.  ?   Mental Status: He is alert.  ?Psychiatric:     ?   Mood and Affect: Mood normal.     ?   Behavior: Behavior normal.  ? ? ?ED Results / Procedures / Treatments   ?Labs ?(all labs ordered are listed, but only abnormal results are displayed) ?Labs Reviewed - No data to display ? ?EKG ?None ? ?Radiology ?No results found. ? ?Procedures ?Procedures  ? ? ?Medications Ordered in ED ?Medications  ?lidocaine (LIDODERM) 5 % 1 patch (1 patch Transdermal Patch Applied 01/02/22 0938)  ?ketorolac (TORADOL) 15 MG/ML injection 15 mg (15 mg Intramuscular Given 01/02/22 0939)  ?cyclobenzaprine (FLEXERIL) tablet 10 mg (10 mg Oral Given 01/02/22 0937)  ? ? ?ED Course/ Medical Decision Making/ A&P ?  ?                        ?  Medical Decision Making ?Risk ?Prescription drug management. ? ? ?This patient presents to the ED for concern of back pain, this involves an extensive number of treatment options, and is a complaint that carries with it a high risk of complications and morbidity.  The differential diagnosis includes musculoskeletal spasm.  I below suspicion for spinal fracture or dislocation.  I doubt any infectious etiologies of the spine.  I doubt cauda equina.  No evidence of sciatica. ? ? ?Co morbidities that complicate the patient evaluation ? ?None ? ? ?Additional history obtained: ? ?Additional history obtained from nursing note ? ? ?Lab Tests: ? ?None ? ? ?Imaging Studies ordered: ? ?None ? ? ?Cardiac Monitoring: ? ?The patient was maintained on a cardiac monitor.  I personally viewed and interpreted the cardiac monitored which showed an underlying  rhythm of: Normal sinus rhythm ? ? ?Medicines ordered and prescription drug management: ? ?I ordered medication including Toradol, Flexeril, and lidocaine patch for back pain ?Reevaluation of the patient after these medicines showed that the patient improved ?I have reviewed the patients home medicines and have made adjustments as needed ? ? ?Test Considered: ? ?None ? ? ?Critical Interventions: ? ?Pain control ? ? ?Problem List / ED Course: ? ?Back pain likely musculoskeletal strain.  Given the mechanism, this is likely a musculoskeletal strain that will take time to heal.  I doubt any emergent pathology at this time given the point tenderness and lack of spinal tenderness.  No obvious step-offs felt on my exam.  No evidence of neurological deficits.  No other back pain red flags.  We will provide him a prescription for naproxen, Flexeril, and lidocaine patches.  We also discussed at home stretching.  Strict return precautions were discussed.  He is safer discharge. ? ? ?Reevaluation: ? ?After the interventions noted above, I reevaluated the patient and found that they have :improved ? ? ?Social Determinants of Health: ? ?Lack of primary care follow-up ? ? ?Dispostion: ? ?After consideration of the diagnostic results and the patients response to treatment, I feel that the patent would benefit from outpatient follow-up with instructions to return for any worsening symptoms. ? ?Final Clinical Impression(s) / ED Diagnoses ?Final diagnoses:  ?Acute left-sided low back pain without sciatica  ? ? ?Rx / DC Orders ?ED Discharge Orders   ? ?      Ordered  ?  cyclobenzaprine (FLEXERIL) 10 MG tablet  2 times daily PRN       ? 01/02/22 1036  ?  lidocaine (LIDODERM) 5 %  Every 24 hours       ? 01/02/22 1036  ?  naproxen (NAPROSYN) 500 MG tablet  2 times daily       ? 01/02/22 1036  ? ?  ?  ? ?  ? ? ?  ?Teressa Lower, PA-C ?01/02/22 1038 ? ?  ?Gerhard Munch, MD ?01/02/22 1601 ? ?
